# Patient Record
Sex: Female | Born: 1975
Health system: Southern US, Community
[De-identification: ages and names within clinical notes are randomized; demographics above are authoritative.]

## PROBLEM LIST (undated history)

## (undated) DIAGNOSIS — K219 Gastro-esophageal reflux disease without esophagitis: Secondary | ICD-10-CM

## (undated) HISTORY — PX: OTHER SURGICAL HISTORY: SHX169

## (undated) HISTORY — DX: Gastro-esophageal reflux disease without esophagitis: K21.9

---

## 1999-07-10 ENCOUNTER — Other Ambulatory Visit: Admission: RE | Admit: 1999-07-10 | Discharge: 1999-07-10 | Payer: Self-pay | Admitting: Obstetrics and Gynecology

## 2000-02-01 ENCOUNTER — Inpatient Hospital Stay (HOSPITAL_COMMUNITY): Admission: AD | Admit: 2000-02-01 | Discharge: 2000-02-01 | Payer: Self-pay | Admitting: *Deleted

## 2000-02-12 ENCOUNTER — Inpatient Hospital Stay (HOSPITAL_COMMUNITY): Admission: AD | Admit: 2000-02-12 | Discharge: 2000-02-12 | Payer: Self-pay | Admitting: Obstetrics and Gynecology

## 2000-02-13 ENCOUNTER — Inpatient Hospital Stay (HOSPITAL_COMMUNITY): Admission: AD | Admit: 2000-02-13 | Discharge: 2000-02-15 | Payer: Self-pay | Admitting: Obstetrics and Gynecology

## 2000-02-18 ENCOUNTER — Encounter: Admission: RE | Admit: 2000-02-18 | Discharge: 2000-05-18 | Payer: Self-pay | Admitting: Obstetrics and Gynecology

## 2000-03-26 ENCOUNTER — Other Ambulatory Visit: Admission: RE | Admit: 2000-03-26 | Discharge: 2000-03-26 | Payer: Self-pay | Admitting: Obstetrics and Gynecology

## 2001-05-06 ENCOUNTER — Other Ambulatory Visit: Admission: RE | Admit: 2001-05-06 | Discharge: 2001-05-06 | Payer: Self-pay | Admitting: Obstetrics and Gynecology

## 2002-05-11 ENCOUNTER — Other Ambulatory Visit: Admission: RE | Admit: 2002-05-11 | Discharge: 2002-05-11 | Payer: Self-pay | Admitting: Obstetrics and Gynecology

## 2003-11-28 ENCOUNTER — Other Ambulatory Visit: Admission: RE | Admit: 2003-11-28 | Discharge: 2003-11-28 | Payer: Self-pay | Admitting: Obstetrics and Gynecology

## 2004-06-18 ENCOUNTER — Inpatient Hospital Stay (HOSPITAL_COMMUNITY): Admission: AD | Admit: 2004-06-18 | Discharge: 2004-06-19 | Payer: Self-pay | Admitting: Obstetrics and Gynecology

## 2004-07-04 ENCOUNTER — Inpatient Hospital Stay (HOSPITAL_COMMUNITY): Admission: AD | Admit: 2004-07-04 | Discharge: 2004-07-04 | Payer: Self-pay | Admitting: Obstetrics and Gynecology

## 2004-07-07 ENCOUNTER — Inpatient Hospital Stay (HOSPITAL_COMMUNITY): Admission: AD | Admit: 2004-07-07 | Discharge: 2004-07-07 | Payer: Self-pay | Admitting: Obstetrics and Gynecology

## 2004-07-12 ENCOUNTER — Inpatient Hospital Stay (HOSPITAL_COMMUNITY): Admission: AD | Admit: 2004-07-12 | Discharge: 2004-07-14 | Payer: Self-pay | Admitting: Obstetrics and Gynecology

## 2004-11-28 ENCOUNTER — Other Ambulatory Visit: Admission: RE | Admit: 2004-11-28 | Discharge: 2004-11-28 | Payer: Self-pay | Admitting: Obstetrics and Gynecology

## 2005-07-08 HISTORY — PX: BREAST REDUCTION SURGERY: SHX8

## 2006-06-06 ENCOUNTER — Other Ambulatory Visit: Admission: RE | Admit: 2006-06-06 | Discharge: 2006-06-06 | Payer: Self-pay | Admitting: Obstetrics and Gynecology

## 2011-09-26 ENCOUNTER — Ambulatory Visit (INDEPENDENT_AMBULATORY_CARE_PROVIDER_SITE_OTHER): Payer: BLUE CROSS/BLUE SHIELD | Admitting: Obstetrics and Gynecology

## 2011-09-26 DIAGNOSIS — Z01419 Encounter for gynecological examination (general) (routine) without abnormal findings: Secondary | ICD-10-CM

## 2012-02-17 ENCOUNTER — Encounter: Payer: Self-pay | Admitting: Gastroenterology

## 2012-02-18 ENCOUNTER — Other Ambulatory Visit: Payer: Self-pay | Admitting: Gastroenterology

## 2012-02-18 DIAGNOSIS — R110 Nausea: Secondary | ICD-10-CM

## 2012-02-18 DIAGNOSIS — R109 Unspecified abdominal pain: Secondary | ICD-10-CM

## 2012-02-25 ENCOUNTER — Encounter (HOSPITAL_COMMUNITY)
Admission: RE | Admit: 2012-02-25 | Discharge: 2012-02-25 | Disposition: A | Discharge status: Home / Self Care | Payer: BC Managed Care – PPO | Source: Ambulatory Visit | Attending: Gastroenterology | Admitting: Gastroenterology

## 2012-02-25 ENCOUNTER — Ambulatory Visit (HOSPITAL_COMMUNITY)
Admission: RE | Admit: 2012-02-25 | Discharge: 2012-02-25 | Disposition: A | Discharge status: Home / Self Care | Payer: BC Managed Care – PPO | Source: Ambulatory Visit | Attending: Gastroenterology | Admitting: Gastroenterology

## 2012-02-25 DIAGNOSIS — R109 Unspecified abdominal pain: Secondary | ICD-10-CM

## 2012-02-25 DIAGNOSIS — R110 Nausea: Secondary | ICD-10-CM

## 2012-02-25 DIAGNOSIS — K7689 Other specified diseases of liver: Secondary | ICD-10-CM | POA: Insufficient documentation

## 2012-02-25 MED ORDER — TECHNETIUM TC 99M MEBROFENIN IV KIT
5.0000 | PACK | Freq: Once | INTRAVENOUS | Status: AC | PRN
  Administered 2012-02-25: 5 via INTRAVENOUS

## 2012-02-25 MED ORDER — SINCALIDE 5 MCG IJ SOLR
0.0200 ug/kg | Freq: Once | INTRAMUSCULAR | Status: AC
  Administered 2012-02-25: 1.5 ug via INTRAVENOUS

## 2012-03-13 ENCOUNTER — Ambulatory Visit: Payer: Self-pay | Admitting: Gastroenterology

## 2012-04-22 ENCOUNTER — Encounter (INDEPENDENT_AMBULATORY_CARE_PROVIDER_SITE_OTHER): Payer: Self-pay | Admitting: Surgery

## 2012-04-28 ENCOUNTER — Encounter (INDEPENDENT_AMBULATORY_CARE_PROVIDER_SITE_OTHER): Payer: Self-pay | Admitting: Surgery

## 2012-04-28 ENCOUNTER — Ambulatory Visit (INDEPENDENT_AMBULATORY_CARE_PROVIDER_SITE_OTHER): Payer: BC Managed Care – PPO | Admitting: Surgery

## 2012-04-28 VITALS — BP 118/81 | HR 80 | Temp 97.8°F | Resp 18 | Ht 64.5 in | Wt 171.4 lb

## 2012-04-28 DIAGNOSIS — K811 Chronic cholecystitis: Secondary | ICD-10-CM

## 2012-04-28 NOTE — Progress Notes (Signed)
Patient ID: Jocelyn Smith, female   DOB: October 26, 1975, 36 y.o.   MRN: [id number]  Chief Complaint  Patient presents with  . Other    abdominal pain, possible gallbladder problem    HPI Jocelyn Smith is a 36 y.o. female.   HPI He is referred by Dr. Loreta Ave for evaluation of epigastric abdominal pain, bloating, and pain through to the back after fatty meals. This is been going on for several months. She has slight nausea but no emesis. She has had a complete workup including ultrasound, HIDA scan, and upper endoscopy. She has had minimal relief with antacid medications. The pain is moderate in intensity. She has no other complaints Past Medical History  Diagnosis Date  . GERD (gastroesophageal reflux disease)     Past Surgical History  Procedure Date  . Breast reduction surgery 2007    History reviewed. No pertinent family history.  Social History History  Substance Use Topics  . Smoking status: Never Smoker   . Smokeless tobacco: Not on file  . Alcohol Use: No    Allergies  Allergen Reactions  . Sulfa Antibiotics Rash    Current Outpatient Prescriptions  Medication Sig Dispense Refill  . Dexlansoprazole (DEXILANT PO) Take by mouth.      Marland Kitchen MINOCYCLINE HCL PO Take by mouth.        Review of Systems Review of Systems  Constitutional: Negative for fever, chills and unexpected weight change.  HENT: Negative for hearing loss, congestion, sore throat, trouble swallowing and voice change.   Eyes: Negative for visual disturbance.  Respiratory: Negative for cough and wheezing.   Cardiovascular: Negative for chest pain, palpitations and leg swelling.  Gastrointestinal: Positive for nausea, abdominal pain and abdominal distention. Negative for vomiting, diarrhea, constipation, blood in stool and anal bleeding.  Genitourinary: Negative for hematuria, vaginal bleeding and difficulty urinating.  Musculoskeletal: Negative for arthralgias.  Skin: Negative for rash and wound.    Neurological: Negative for seizures, syncope and headaches.  Hematological: Negative for adenopathy. Does not bruise/bleed easily.  Psychiatric/Behavioral: Negative for confusion.    Blood pressure 118/81, pulse 80, temperature 97.8 F (36.6 C), temperature source Temporal, resp. rate 18, height 5' 4.5" (1.638 m), weight 171 lb 6.4 oz (77.747 kg).  Physical Exam Physical Exam  Constitutional: She is oriented to person, place, and time. She appears well-developed and well-nourished. No distress.  HENT:  Head: Normocephalic and atraumatic.  Right Ear: External ear normal.  Left Ear: External ear normal.  Nose: Nose normal.  Mouth/Throat: Oropharynx is clear and moist. No oropharyngeal exudate.  Eyes: Conjunctivae normal are normal. Pupils are equal, round, and reactive to light. Right eye exhibits no discharge. Left eye exhibits no discharge. No scleral icterus.  Neck: Normal range of motion. Neck supple. No tracheal deviation present.  Cardiovascular: Normal rate, regular rhythm, normal heart sounds and intact distal pulses.   No murmur heard. Pulmonary/Chest: Effort normal and breath sounds normal. No respiratory distress. She has no wheezes. She has no rales.  Abdominal: Soft. Bowel sounds are normal. She exhibits no distension. There is no tenderness. There is no rebound and no guarding.  Musculoskeletal: Normal range of motion. She exhibits no edema and no tenderness.  Lymphadenopathy:    She has no cervical adenopathy.  Neurological: She is alert and oriented to person, place, and time.  Skin: Skin is warm and dry. No rash noted. She is not diaphoretic. No erythema.  Psychiatric: Her behavior is normal. Judgment normal.    Data  Reviewed I have reviewed the patient's scans and endoscopy report  Assessment    Chronic cholecystitis    Plan    I do suspect the gallbladder causing her symptoms. Laparoscopic cholecystectomy is recommended and she is eager to proceed. I  discussed the risk of surgery which includes but is not limited to bleeding, infection, bile duct injury, bile leak, injury to other structures, the need to convert to an open procedure, the chance this may not resolve her symptoms, etc. She understands and wishes to proceed. Likelihood of success is good       Jocelyn Smith A 04/28/2012, 10:21 AM

## 2012-05-11 ENCOUNTER — Encounter (HOSPITAL_COMMUNITY): Payer: Self-pay | Admitting: Pharmacy Technician

## 2012-05-11 NOTE — Progress Notes (Signed)
Instructed as SDS . Aware to have responsible driver and person x 24 hrs post op. Aware to use Hibiclens soap to shower x2 nights prior and AM if shower desired,  per preparing for surgery guidelines. W. Kennon Portela

## 2012-05-19 NOTE — Interval H&P Note (Signed)
History and Physical Interval Note:  05/19/2012 6:55 AM  Jocelyn Smith  has presented today for surgery, with the diagnosis of chronic chol  The various methods of treatment have been discussed with the patient and family. After consideration of risks, benefits and other options for treatment, the patient has consented to  Procedure(s) (LRB) with comments: LAPAROSCOPIC CHOLECYSTECTOMY (N/A) as a surgical intervention .  The patient's history has been reviewed, patient examined, no change in status, stable for surgery.  I have reviewed the patient's chart and labs.  Questions were answered to the patient's satisfaction.     Spero Gunnels A

## 2012-05-19 NOTE — H&P (View-Only) (Signed)
Patient ID: Jocelyn Smith, female   DOB: 04/24/1976, 36 y.o.   MRN: [id number]  Chief Complaint  Patient presents with  . Other    abdominal pain, possible gallbladder problem    HPI Jocelyn Smith is a 36 y.o. female.   HPI He is referred by Dr. Mann for evaluation of epigastric abdominal pain, bloating, and pain through to the back after fatty meals. This is been going on for several months. She has slight nausea but no emesis. She has had a complete workup including ultrasound, HIDA scan, and upper endoscopy. She has had minimal relief with antacid medications. The pain is moderate in intensity. She has no other complaints Past Medical History  Diagnosis Date  . GERD (gastroesophageal reflux disease)     Past Surgical History  Procedure Date  . Breast reduction surgery 2007    History reviewed. No pertinent family history.  Social History History  Substance Use Topics  . Smoking status: Never Smoker   . Smokeless tobacco: Not on file  . Alcohol Use: No    Allergies  Allergen Reactions  . Sulfa Antibiotics Rash    Current Outpatient Prescriptions  Medication Sig Dispense Refill  . Dexlansoprazole (DEXILANT PO) Take by mouth.      . MINOCYCLINE HCL PO Take by mouth.        Review of Systems Review of Systems  Constitutional: Negative for fever, chills and unexpected weight change.  HENT: Negative for hearing loss, congestion, sore throat, trouble swallowing and voice change.   Eyes: Negative for visual disturbance.  Respiratory: Negative for cough and wheezing.   Cardiovascular: Negative for chest pain, palpitations and leg swelling.  Gastrointestinal: Positive for nausea, abdominal pain and abdominal distention. Negative for vomiting, diarrhea, constipation, blood in stool and anal bleeding.  Genitourinary: Negative for hematuria, vaginal bleeding and difficulty urinating.  Musculoskeletal: Negative for arthralgias.  Skin: Negative for rash and wound.    Neurological: Negative for seizures, syncope and headaches.  Hematological: Negative for adenopathy. Does not bruise/bleed easily.  Psychiatric/Behavioral: Negative for confusion.    Blood pressure 118/81, pulse 80, temperature 97.8 F (36.6 C), temperature source Temporal, resp. rate 18, height 5' 4.5" (1.638 m), weight 171 lb 6.4 oz (77.747 kg).  Physical Exam Physical Exam  Constitutional: She is oriented to person, place, and time. She appears well-developed and well-nourished. No distress.  HENT:  Head: Normocephalic and atraumatic.  Right Ear: External ear normal.  Left Ear: External ear normal.  Nose: Nose normal.  Mouth/Throat: Oropharynx is clear and moist. No oropharyngeal exudate.  Eyes: Conjunctivae normal are normal. Pupils are equal, round, and reactive to light. Right eye exhibits no discharge. Left eye exhibits no discharge. No scleral icterus.  Neck: Normal range of motion. Neck supple. No tracheal deviation present.  Cardiovascular: Normal rate, regular rhythm, normal heart sounds and intact distal pulses.   No murmur heard. Pulmonary/Chest: Effort normal and breath sounds normal. No respiratory distress. She has no wheezes. She has no rales.  Abdominal: Soft. Bowel sounds are normal. She exhibits no distension. There is no tenderness. There is no rebound and no guarding.  Musculoskeletal: Normal range of motion. She exhibits no edema and no tenderness.  Lymphadenopathy:    She has no cervical adenopathy.  Neurological: She is alert and oriented to person, place, and time.  Skin: Skin is warm and dry. No rash noted. She is not diaphoretic. No erythema.  Psychiatric: Her behavior is normal. Judgment normal.    Data   Reviewed I have reviewed the patient's scans and endoscopy report  Assessment    Chronic cholecystitis    Plan    I do suspect the gallbladder causing her symptoms. Laparoscopic cholecystectomy is recommended and she is eager to proceed. I  discussed the risk of surgery which includes but is not limited to bleeding, infection, bile duct injury, bile leak, injury to other structures, the need to convert to an open procedure, the chance this may not resolve her symptoms, etc. She understands and wishes to proceed. Likelihood of success is good       Arieliz Latino A 04/28/2012, 10:21 AM    

## 2012-05-20 ENCOUNTER — Encounter (HOSPITAL_COMMUNITY): Payer: Self-pay | Admitting: Anesthesiology

## 2012-05-20 ENCOUNTER — Ambulatory Visit (HOSPITAL_COMMUNITY)
Admission: RE | Admit: 2012-05-20 | Discharge: 2012-05-20 | Disposition: A | Discharge status: Home / Self Care | Payer: BC Managed Care – PPO | Source: Ambulatory Visit | Attending: Surgery | Admitting: Surgery

## 2012-05-20 ENCOUNTER — Encounter (HOSPITAL_COMMUNITY): Payer: Self-pay | Admitting: *Deleted

## 2012-05-20 ENCOUNTER — Encounter (HOSPITAL_COMMUNITY)
Admission: RE | Disposition: A | Discharge status: Home / Self Care | Payer: Self-pay | Source: Ambulatory Visit | Attending: Surgery

## 2012-05-20 ENCOUNTER — Ambulatory Visit (HOSPITAL_COMMUNITY): Payer: BC Managed Care – PPO | Admitting: Anesthesiology

## 2012-05-20 DIAGNOSIS — Z79899 Other long term (current) drug therapy: Secondary | ICD-10-CM | POA: Insufficient documentation

## 2012-05-20 DIAGNOSIS — K811 Chronic cholecystitis: Secondary | ICD-10-CM | POA: Insufficient documentation

## 2012-05-20 DIAGNOSIS — K824 Cholesterolosis of gallbladder: Secondary | ICD-10-CM

## 2012-05-20 DIAGNOSIS — K219 Gastro-esophageal reflux disease without esophagitis: Secondary | ICD-10-CM | POA: Insufficient documentation

## 2012-05-20 HISTORY — PX: CHOLECYSTECTOMY: SHX55

## 2012-05-20 SURGERY — LAPAROSCOPIC CHOLECYSTECTOMY
Anesthesia: General | Site: Abdomen | Wound class: Clean Contaminated

## 2012-05-20 MED ORDER — SODIUM CHLORIDE 0.9 % IV SOLN: 250.0000 mL | INTRAVENOUS | Status: DC | PRN

## 2012-05-20 MED ORDER — SUCCINYLCHOLINE CHLORIDE 20 MG/ML IJ SOLN
INTRAMUSCULAR | Status: DC | PRN
  Administered 2012-05-20: 100 mg via INTRAVENOUS

## 2012-05-20 MED ORDER — SODIUM CHLORIDE 0.9 % IJ SOLN: 3.0000 mL | INTRAMUSCULAR | Status: DC | PRN

## 2012-05-20 MED ORDER — 0.9 % SODIUM CHLORIDE (POUR BTL) OPTIME
TOPICAL | Status: DC | PRN
  Administered 2012-05-20: 1000 mL

## 2012-05-20 MED ORDER — ACETAMINOPHEN 10 MG/ML IV SOLN
INTRAVENOUS | Status: AC
  Filled 2012-05-20: qty 100

## 2012-05-20 MED ORDER — PROMETHAZINE HCL 25 MG/ML IJ SOLN
INTRAMUSCULAR | Status: AC
  Filled 2012-05-20: qty 1

## 2012-05-20 MED ORDER — LACTATED RINGERS IV SOLN
INTRAVENOUS | Status: DC | PRN
  Administered 2012-05-20 (×2): via INTRAVENOUS

## 2012-05-20 MED ORDER — PROMETHAZINE HCL 25 MG/ML IJ SOLN
6.2500 mg | INTRAMUSCULAR | Status: AC | PRN
  Administered 2012-05-20 (×2): 6.25 mg via INTRAVENOUS

## 2012-05-20 MED ORDER — METOCLOPRAMIDE HCL 5 MG/ML IJ SOLN
INTRAMUSCULAR | Status: DC | PRN
  Administered 2012-05-20: 10 mg via INTRAVENOUS

## 2012-05-20 MED ORDER — ONDANSETRON HCL 4 MG/2ML IJ SOLN: 4.0000 mg | Freq: Four times a day (QID) | INTRAMUSCULAR | Status: DC | PRN

## 2012-05-20 MED ORDER — PROPOFOL 10 MG/ML IV BOLUS
INTRAVENOUS | Status: DC | PRN
  Administered 2012-05-20: 150 mg via INTRAVENOUS

## 2012-05-20 MED ORDER — NEOSTIGMINE METHYLSULFATE 1 MG/ML IJ SOLN
INTRAMUSCULAR | Status: DC | PRN
  Administered 2012-05-20: 3.5 mg via INTRAVENOUS

## 2012-05-20 MED ORDER — DEXAMETHASONE SODIUM PHOSPHATE 10 MG/ML IJ SOLN
INTRAMUSCULAR | Status: DC | PRN
  Administered 2012-05-20: 10 mg via INTRAVENOUS

## 2012-05-20 MED ORDER — BUPIVACAINE HCL (PF) 0.5 % IJ SOLN
INTRAMUSCULAR | Status: DC | PRN
  Administered 2012-05-20: 20 mL

## 2012-05-20 MED ORDER — FENTANYL CITRATE 0.05 MG/ML IJ SOLN
INTRAMUSCULAR | Status: DC | PRN
  Administered 2012-05-20: 50 ug via INTRAVENOUS
  Administered 2012-05-20: 100 ug via INTRAVENOUS

## 2012-05-20 MED ORDER — MUPIROCIN 2 % EX OINT
TOPICAL_OINTMENT | CUTANEOUS | Status: AC
  Filled 2012-05-20: qty 22

## 2012-05-20 MED ORDER — SODIUM CHLORIDE 0.9 % IJ SOLN: 3.0000 mL | Freq: Two times a day (BID) | INTRAMUSCULAR | Status: DC

## 2012-05-20 MED ORDER — IOHEXOL 300 MG/ML  SOLN
INTRAMUSCULAR | Status: AC
Start: 2012-05-20 — End: 2012-05-20
  Filled 2012-05-20: qty 1

## 2012-05-20 MED ORDER — MORPHINE SULFATE 10 MG/ML IJ SOLN: 4.0000 mg | INTRAMUSCULAR | Status: DC | PRN

## 2012-05-20 MED ORDER — CEFAZOLIN SODIUM-DEXTROSE 2-3 GM-% IV SOLR
INTRAVENOUS | Status: AC
  Filled 2012-05-20: qty 50

## 2012-05-20 MED ORDER — ACETAMINOPHEN 650 MG RE SUPP
650.0000 mg | RECTAL | Status: DC | PRN
  Filled 2012-05-20: qty 1

## 2012-05-20 MED ORDER — LACTATED RINGERS IV SOLN
INTRAVENOUS | Status: DC | PRN
  Administered 2012-05-20: 1000 mL

## 2012-05-20 MED ORDER — LIDOCAINE HCL (CARDIAC) 20 MG/ML IV SOLN
INTRAVENOUS | Status: DC | PRN
  Administered 2012-05-20: 50 mg via INTRAVENOUS

## 2012-05-20 MED ORDER — OXYCODONE-ACETAMINOPHEN 5-325 MG PO TABS: ORAL_TABLET | ORAL | Status: DC

## 2012-05-20 MED ORDER — CEFAZOLIN SODIUM-DEXTROSE 2-3 GM-% IV SOLR
2.0000 g | INTRAVENOUS | Status: AC
  Administered 2012-05-20: 2 g via INTRAVENOUS

## 2012-05-20 MED ORDER — ROCURONIUM BROMIDE 100 MG/10ML IV SOLN
INTRAVENOUS | Status: DC | PRN
  Administered 2012-05-20: 10 mg via INTRAVENOUS

## 2012-05-20 MED ORDER — FENTANYL CITRATE 0.05 MG/ML IJ SOLN: 25.0000 ug | INTRAMUSCULAR | Status: DC | PRN

## 2012-05-20 MED ORDER — ACETAMINOPHEN 10 MG/ML IV SOLN
INTRAVENOUS | Status: DC | PRN
  Administered 2012-05-20: 1000 mg via INTRAVENOUS

## 2012-05-20 MED ORDER — KETOROLAC TROMETHAMINE 30 MG/ML IJ SOLN: 15.0000 mg | Freq: Once | INTRAMUSCULAR | Status: DC | PRN

## 2012-05-20 MED ORDER — ACETAMINOPHEN 325 MG PO TABS: 650.0000 mg | ORAL_TABLET | ORAL | Status: DC | PRN

## 2012-05-20 MED ORDER — BUPIVACAINE HCL (PF) 0.5 % IJ SOLN
INTRAMUSCULAR | Status: AC
  Filled 2012-05-20: qty 30

## 2012-05-20 MED ORDER — OXYCODONE HCL 5 MG PO TABS
5.0000 mg | ORAL_TABLET | ORAL | Status: DC | PRN
  Administered 2012-05-20: 5 mg via ORAL
  Filled 2012-05-20: qty 1

## 2012-05-20 MED ORDER — MIDAZOLAM HCL 5 MG/5ML IJ SOLN
INTRAMUSCULAR | Status: DC | PRN
  Administered 2012-05-20: 2 mg via INTRAVENOUS

## 2012-05-20 MED ORDER — KETOROLAC TROMETHAMINE 30 MG/ML IJ SOLN
INTRAMUSCULAR | Status: DC | PRN
  Administered 2012-05-20: 30 mg via INTRAVENOUS

## 2012-05-20 MED ORDER — ONDANSETRON HCL 4 MG/2ML IJ SOLN
INTRAMUSCULAR | Status: DC | PRN
  Administered 2012-05-20: 4 mg via INTRAVENOUS

## 2012-05-20 MED ORDER — GLYCOPYRROLATE 0.2 MG/ML IJ SOLN
INTRAMUSCULAR | Status: DC | PRN
  Administered 2012-05-20: 0.4 mg via INTRAVENOUS

## 2012-05-20 SURGICAL SUPPLY — 39 items
APL SKNCLS STERI-STRIP NONHPOA (GAUZE/BANDAGES/DRESSINGS) ×1
APPLIER CLIP 5 13 M/L LIGAMAX5 (MISCELLANEOUS) ×2
APR CLP MED LRG 5 ANG JAW (MISCELLANEOUS) ×1
BAG SPEC RTRVL LRG 6X4 10 (ENDOMECHANICALS) ×1
BANDAGE ADHESIVE 1X3 (GAUZE/BANDAGES/DRESSINGS) ×2 IMPLANT
BENZOIN TINCTURE PRP APPL 2/3 (GAUZE/BANDAGES/DRESSINGS) ×2 IMPLANT
CANISTER SUCTION 2500CC (MISCELLANEOUS) ×2 IMPLANT
CHLORAPREP W/TINT 26ML (MISCELLANEOUS) ×2 IMPLANT
CLIP APPLIE 5 13 M/L LIGAMAX5 (MISCELLANEOUS) IMPLANT
CLOTH BEACON ORANGE TIMEOUT ST (SAFETY) ×2 IMPLANT
COVER MAYO STAND STRL (DRAPES) IMPLANT
DECANTER SPIKE VIAL GLASS SM (MISCELLANEOUS) ×2 IMPLANT
DRAPE C-ARM 42X72 X-RAY (DRAPES) IMPLANT
DRAPE LAPAROSCOPIC ABDOMINAL (DRAPES) ×2 IMPLANT
ELECT REM PT RETURN 9FT ADLT (ELECTROSURGICAL) ×2
ELECTRODE REM PT RTRN 9FT ADLT (ELECTROSURGICAL) ×1 IMPLANT
GLOVE BIO SURGEON STRL SZ 6.5 (GLOVE) ×1 IMPLANT
GLOVE BIOGEL PI IND STRL 6.5 (GLOVE) IMPLANT
GLOVE BIOGEL PI IND STRL 7.0 (GLOVE) ×1 IMPLANT
GLOVE BIOGEL PI INDICATOR 6.5 (GLOVE) ×1
GLOVE BIOGEL PI INDICATOR 7.0 (GLOVE)
GLOVE SURG SIGNA 7.5 PF LTX (GLOVE) ×3 IMPLANT
GLOVE SURG SS PI 6.5 STRL IVOR (GLOVE) ×1 IMPLANT
GOWN STRL NON-REIN LRG LVL3 (GOWN DISPOSABLE) ×2 IMPLANT
GOWN STRL REIN XL XLG (GOWN DISPOSABLE) ×4 IMPLANT
HEMOSTAT SURGICEL 4X8 (HEMOSTASIS) IMPLANT
KIT BASIN OR (CUSTOM PROCEDURE TRAY) ×2 IMPLANT
NS IRRIG 1000ML POUR BTL (IV SOLUTION) ×1 IMPLANT
POUCH SPECIMEN RETRIEVAL 10MM (ENDOMECHANICALS) ×2 IMPLANT
SET CHOLANGIOGRAPH MIX (MISCELLANEOUS) IMPLANT
SET IRRIG TUBING LAPAROSCOPIC (IRRIGATION / IRRIGATOR) ×2 IMPLANT
SOLUTION ANTI FOG 6CC (MISCELLANEOUS) ×2 IMPLANT
STRIP CLOSURE SKIN 1/2X4 (GAUZE/BANDAGES/DRESSINGS) ×2 IMPLANT
SUT MNCRL AB 4-0 PS2 18 (SUTURE) ×2 IMPLANT
TOWEL OR 17X26 10 PK STRL BLUE (TOWEL DISPOSABLE) ×2 IMPLANT
TRAY LAP CHOLE (CUSTOM PROCEDURE TRAY) ×2 IMPLANT
TROCAR BLADELESS OPT 5 75 (ENDOMECHANICALS) ×6 IMPLANT
TROCAR XCEL BLUNT TIP 100MML (ENDOMECHANICALS) ×2 IMPLANT
TUBING INSUFFLATION 10FT LAP (TUBING) ×2 IMPLANT

## 2012-05-20 NOTE — Op Note (Signed)

## 2012-05-20 NOTE — Preoperative (Signed)
Beta Blockers   Reason not to administer Beta Blockers:Not Applicable 

## 2012-05-20 NOTE — Interval H&P Note (Signed)
History and Physical Interval Note: no change in H and P  05/20/2012 7:02 AM  Jocelyn Smith  has presented today for surgery, with the diagnosis of gallbladder problems  The various methods of treatment have been discussed with the patient and family. After consideration of risks, benefits and other options for treatment, the patient has consented to  Procedure(s) (LRB) with comments: LAPAROSCOPIC CHOLECYSTECTOMY (N/A) as a surgical intervention .  The patient's history has been reviewed, patient examined, no change in status, stable for surgery.  I have reviewed the patient's chart and labs.  Questions were answered to the patient's satisfaction.     Anas Reister A

## 2012-05-20 NOTE — Anesthesia Preprocedure Evaluation (Addendum)
Anesthesia Evaluation  Patient identified by MRN, date of birth, ID band Patient awake    Reviewed: Allergy & Precautions, H&P , NPO status , Patient's Chart, lab work & pertinent test results  Airway Mallampati: II TM Distance: <3 FB Neck ROM: Full    Dental No notable dental hx. (+) Dental Advisory Given   Pulmonary neg pulmonary ROS,  breath sounds clear to auscultation  Pulmonary exam normal       Cardiovascular negative cardio ROS  Rhythm:Regular Rate:Normal     Neuro/Psych negative neurological ROS  negative psych ROS   GI/Hepatic Neg liver ROS, GERD-  ,  Endo/Other  negative endocrine ROS  Renal/GU negative Renal ROS  negative genitourinary   Musculoskeletal negative musculoskeletal ROS (+)   Abdominal   Peds negative pediatric ROS (+)  Hematology negative hematology ROS (+)   Anesthesia Other Findings   Reproductive/Obstetrics negative OB ROS                           Anesthesia Physical Anesthesia Plan  ASA: I  Anesthesia Plan: General   Post-op Pain Management:    Induction: Intravenous  Airway Management Planned: Oral ETT  Additional Equipment:   Intra-op Plan:   Post-operative Plan: Extubation in OR  Informed Consent: I have reviewed the patients History and Physical, chart, labs and discussed the procedure including the risks, benefits and alternatives for the proposed anesthesia with the patient or authorized representative who has indicated his/her understanding and acceptance.   Dental advisory given  Plan Discussed with: CRNA and Surgeon  Anesthesia Plan Comments:         Anesthesia Quick Evaluation

## 2012-05-20 NOTE — Anesthesia Postprocedure Evaluation (Signed)
  Anesthesia Post-op Note  Patient: Jocelyn Smith  Procedure(s) Performed: Procedure(s) (LRB): LAPAROSCOPIC CHOLECYSTECTOMY (N/A)  Patient Location: PACU  Anesthesia Type: General  Level of Consciousness: awake and alert   Airway and Oxygen Therapy: Patient Spontanous Breathing  Post-op Pain: mild  Post-op Assessment: Post-op Vital signs reviewed, Patient's Cardiovascular Status Stable, Respiratory Function Stable, Patent Airway and No signs of Nausea or vomiting  Post-op Vital Signs: stable  Complications: No apparent anesthesia complications

## 2012-05-20 NOTE — Transfer of Care (Signed)
Immediate Anesthesia Transfer of Care Note  Patient: Jocelyn Smith  Procedure(s) Performed: Procedure(s) (LRB) with comments: LAPAROSCOPIC CHOLECYSTECTOMY (N/A)  Patient Location: PACU  Anesthesia Type:General  Level of Consciousness: sedated, patient cooperative and responds to stimulation  Airway & Oxygen Therapy: Patient Spontanous Breathing and Patient connected to face mask oxygen  Post-op Assessment: Report given to PACU RN, Post -op Vital signs reviewed and stable and Patient moving all extremities X 4  Post vital signs: Reviewed and stable  Complications: No apparent anesthesia complications

## 2012-05-20 NOTE — H&P (Signed)
Patient ID: Jocelyn Smith, female DOB: 1975-12-04, 36 y.o. MRN: [id number]  Chief Complaint   Patient presents with   .  Other     abdominal pain, possible gallbladder problem    HPI  Jocelyn Smith is a 36 y.o. female.  HPI  He is referred by Dr. Loreta Ave for evaluation of epigastric abdominal pain, bloating, and pain through to the back after fatty meals. This is been going on for several months. She has slight nausea but no emesis. She has had a complete workup including ultrasound, HIDA scan, and upper endoscopy. She has had minimal relief with antacid medications. The pain is moderate in intensity. She has no other complaints  Past Medical History   Diagnosis  Date   .  GERD (gastroesophageal reflux disease)     Past Surgical History   Procedure  Date   .  Breast reduction surgery  2007    History reviewed. No pertinent family history.  Social History  History   Substance Use Topics   .  Smoking status:  Never Smoker   .  Smokeless tobacco:  Not on file   .  Alcohol Use:  No    Allergies   Allergen  Reactions   .  Sulfa Antibiotics  Rash    Current Outpatient Prescriptions   Medication  Sig  Dispense  Refill   .  Dexlansoprazole (DEXILANT PO)  Take by mouth.     Marland Kitchen  MINOCYCLINE HCL PO  Take by mouth.      Review of Systems  Review of Systems  Constitutional: Negative for fever, chills and unexpected weight change.  HENT: Negative for hearing loss, congestion, sore throat, trouble swallowing and voice change.  Eyes: Negative for visual disturbance.  Respiratory: Negative for cough and wheezing.  Cardiovascular: Negative for chest pain, palpitations and leg swelling.  Gastrointestinal: Positive for nausea, abdominal pain and abdominal distention. Negative for vomiting, diarrhea, constipation, blood in stool and anal bleeding.  Genitourinary: Negative for hematuria, vaginal bleeding and difficulty urinating.  Musculoskeletal: Negative for arthralgias.  Skin: Negative  for rash and wound.  Neurological: Negative for seizures, syncope and headaches.  Hematological: Negative for adenopathy. Does not bruise/bleed easily.  Psychiatric/Behavioral: Negative for confusion.   Blood pressure 118/81, pulse 80, temperature 97.8 F (36.6 C), temperature source Temporal, resp. rate 18, height 5' 4.5" (1.638 m), weight 171 lb 6.4 oz (77.747 kg).  Physical Exam  Physical Exam  Constitutional: She is oriented to person, place, and time. She appears well-developed and well-nourished. No distress.  HENT:  Head: Normocephalic and atraumatic.  Right Ear: External ear normal.  Left Ear: External ear normal.  Nose: Nose normal.  Mouth/Throat: Oropharynx is clear and moist. No oropharyngeal exudate.  Eyes: Conjunctivae normal are normal. Pupils are equal, round, and reactive to light. Right eye exhibits no discharge. Left eye exhibits no discharge. No scleral icterus.  Neck: Normal range of motion. Neck supple. No tracheal deviation present.  Cardiovascular: Normal rate, regular rhythm, normal heart sounds and intact distal pulses.  No murmur heard.  Pulmonary/Chest: Effort normal and breath sounds normal. No respiratory distress. She has no wheezes. She has no rales.  Abdominal: Soft. Bowel sounds are normal. She exhibits no distension. There is no tenderness. There is no rebound and no guarding.  Musculoskeletal: Normal range of motion. She exhibits no edema and no tenderness.  Lymphadenopathy:  She has no cervical adenopathy.  Neurological: She is alert and oriented to person, place, and time.  Skin: Skin is warm and dry. No rash noted. She is not diaphoretic. No erythema.  Psychiatric: Her behavior is normal. Judgment normal.   Data Reviewed  I have reviewed the patient's scans and endoscopy report  Assessment   Chronic cholecystitis   Plan   I do suspect the gallbladder causing her symptoms. Laparoscopic cholecystectomy is recommended and she is eager to  proceed. I discussed the risk of surgery which includes but is not limited to bleeding, infection, bile duct injury, bile leak, injury to other structures, the need to convert to an open procedure, the chance this may not resolve her symptoms, etc. She understands and wishes to proceed. Likelihood of success is good   Creed Kail A

## 2012-05-20 NOTE — Progress Notes (Signed)
Nausea relieved after taking a nap

## 2012-05-20 NOTE — Progress Notes (Signed)
Continues to complain with nausea. Up to BR frequent burps and feeling a little better. Back to bed to sleep

## 2012-05-21 ENCOUNTER — Encounter (HOSPITAL_COMMUNITY): Payer: Self-pay | Admitting: Surgery

## 2012-05-23 LAB — MRSA CULTURE

## 2012-05-25 ENCOUNTER — Encounter (INDEPENDENT_AMBULATORY_CARE_PROVIDER_SITE_OTHER): Payer: Self-pay | Admitting: General Surgery

## 2012-06-02 ENCOUNTER — Encounter (INDEPENDENT_AMBULATORY_CARE_PROVIDER_SITE_OTHER): Payer: Self-pay | Admitting: Surgery

## 2012-06-02 ENCOUNTER — Ambulatory Visit (INDEPENDENT_AMBULATORY_CARE_PROVIDER_SITE_OTHER): Payer: BC Managed Care – PPO | Admitting: Surgery

## 2012-06-02 VITALS — BP 104/68 | HR 84 | Temp 98.0°F | Ht 64.0 in | Wt 176.2 lb

## 2012-06-02 DIAGNOSIS — Z09 Encounter for follow-up examination after completed treatment for conditions other than malignant neoplasm: Secondary | ICD-10-CM

## 2012-06-02 NOTE — Progress Notes (Signed)
Subjective:     Patient ID: Jocelyn Smith, female   DOB: Dec 26, 1975, 36 y.o.   MRN: [id number]  HPI She is here for her first postop visit status post laparoscopic cholecystectomy. Most of her symptoms have resolved. She is having occasional loose bowel movements.  Review of Systems     Objective:   Physical Exam Her incisions are well healed. The final pathology showed mild chronic cholecystitis    Assessment:     Patient doing well status post laparoscopic cholecystectomy    Plan:      she may return to normal activity.I will see her back as needed

## 2012-07-21 ENCOUNTER — Encounter (INDEPENDENT_AMBULATORY_CARE_PROVIDER_SITE_OTHER): Payer: Self-pay

## 2013-09-27 IMAGING — NM NM HEPATO W/GB/PHARM/[PERSON_NAME]
3 series · 13 of 13 positions shown · non-contrast
Comparison: 02/25/2012 ultrasound abdomen

CLINICAL DATA: Abdominal pain and nausea

NUCLEAR MEDICINE HEPATOBILIARY IMAGING WITH GALLBLADDER EF
TECHNIQUE: Sequential images of the abdomen were obtained [DATE] minutes following intravenous administration of
radiopharmaceutical.  After slow intravenous infusion of
micrograms Cholecystokinin, gallbladder ejection fraction was
determined.
Radiopharmaceutical:  5.0 mCi 7c-TTm Choletec

[he hepatobiliary · 3.43mm/px · 6 of 30 frames shown (1 of 3)]
[frame 3/30]
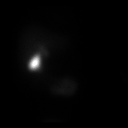
[frame 8/30]
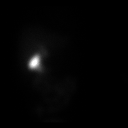
[frame 13/30]
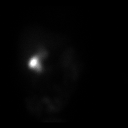
[frame 18/30]
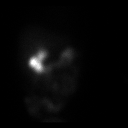
[frame 23/30]
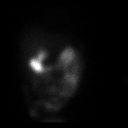
[frame 28/30]
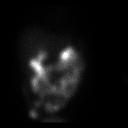

[he hepatobiliary · 3.43mm/px · 6 of 48 frames shown (2 of 3)]
[frame 5/48]
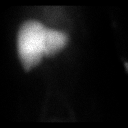
[frame 13/48]
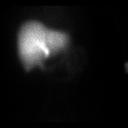
[frame 21/48]
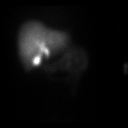
[frame 29/48]
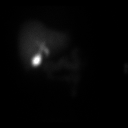
[frame 37/48]
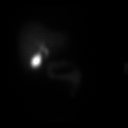
[frame 45/48]
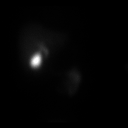

[he hepatobiliary · 1 of 1 slices shown (3 of 3)]
[im 1/1]
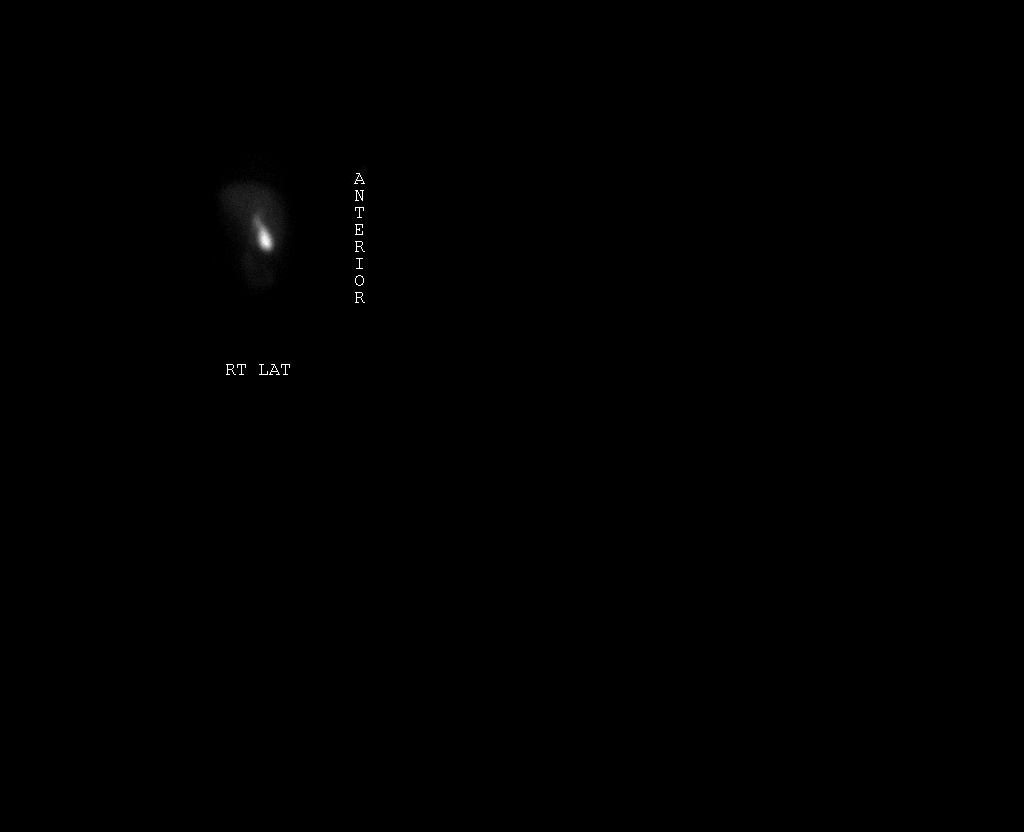

[13 of 13 positions shown; findings below may reference images not displayed]

FINDINGS: Normal hepatic uptake and excretion of the biliary
tracer.  Gallbladder is well visualized and continues to accumulate
activity during the study.  Activity progresses throughout the
small bowel.  Therefore, the cystic duct and common bile duct are
patent.  Negative for cholecystitis.

CCK administered for gallbladder stimulation.  Ejection fraction
calculated after 30 minutes measuring 75% which is within normal
limits.

The patient did not experience symptoms during CCK infusion.
IMPRESSION: Normal hepatobiliary scan and gallbladder ejection fraction

## 2016-07-25 ENCOUNTER — Emergency Department (HOSPITAL_BASED_OUTPATIENT_CLINIC_OR_DEPARTMENT_OTHER)
Admission: EM | Admit: 2016-07-25 | Discharge: 2016-07-25 | Disposition: A | Discharge status: Home / Self Care | Payer: 59 | Attending: Emergency Medicine | Admitting: Emergency Medicine

## 2016-07-25 ENCOUNTER — Encounter (HOSPITAL_BASED_OUTPATIENT_CLINIC_OR_DEPARTMENT_OTHER): Payer: Self-pay | Admitting: *Deleted

## 2016-07-25 DIAGNOSIS — M545 Low back pain: Secondary | ICD-10-CM | POA: Diagnosis present

## 2016-07-25 DIAGNOSIS — M5440 Lumbago with sciatica, unspecified side: Secondary | ICD-10-CM | POA: Diagnosis not present

## 2016-07-25 MED ORDER — HYDROCODONE-ACETAMINOPHEN 5-325 MG PO TABS: 1.0000 | ORAL_TABLET | Freq: Four times a day (QID) | ORAL | 0 refills | Status: AC | PRN

## 2016-07-25 MED ORDER — HYDROCODONE-ACETAMINOPHEN 5-325 MG PO TABS
2.0000 | ORAL_TABLET | Freq: Once | ORAL | Status: AC
  Administered 2016-07-25: 2 via ORAL
  Filled 2016-07-25: qty 2

## 2016-07-25 MED ORDER — KETOROLAC TROMETHAMINE 60 MG/2ML IM SOLN
60.0000 mg | Freq: Once | INTRAMUSCULAR | Status: AC
  Administered 2016-07-25: 60 mg via INTRAMUSCULAR
  Filled 2016-07-25: qty 2

## 2016-07-25 MED ORDER — NAPROXEN 500 MG PO TABS: 500.0000 mg | ORAL_TABLET | Freq: Two times a day (BID) | ORAL | 0 refills | Status: AC

## 2016-07-25 MED FILL — HYDROCODON-APAP 5-325: 5-325 | 2 days supply | Qty: 15 | Fill #0

## 2016-07-25 MED FILL — NAPROXEN 500 MG TABLET: 500 | 10 days supply | Qty: 20 | Fill #0

## 2016-07-25 NOTE — ED Notes (Signed)
Pt has a ride at bedside. Directed to pharmacy to pick up medications

## 2016-07-25 NOTE — ED Triage Notes (Signed)
Pt reports hx of back pain, usually takes half tab of valium and pain resolved, took half tab yesterday with no relief, cont with pain. Took motrin at [facility]3am.

## 2016-07-25 NOTE — ED Provider Notes (Signed)
[facility] Provider Note   CSN: [id number] Arrival date & time: 07/25/16  1025     History   Chief Complaint Chief Complaint  Patient presents with  . Back Pain    HPI Jocelyn Smith is a 41 y.o. female.  Patient is a 41 year old female with history of GERD and intermittent back pain over the past 2 years. She presents for evaluation of back pain. This worsened several days ago in the absence of any injury or trauma. She reports pain in her low back that radiates into her legs. She denies any weakness or bowel or bladder complaints.   The history is provided by the patient.  Back Pain   This is a recurrent problem. The current episode started 2 days ago. The problem occurs constantly. The problem has been gradually worsening. The pain is associated with no known injury. The pain is present in the lumbar spine. The quality of the pain is described as stabbing. The pain radiates to the left thigh and right thigh. The pain is moderate. The symptoms are aggravated by bending, twisting and certain positions. The pain is the same all the time. Pertinent negatives include no fever, no bowel incontinence, no bladder incontinence, no dysuria, no paresis, no tingling and no weakness. She has tried NSAIDs for the symptoms.    Past Medical History:  Diagnosis Date  . GERD (gastroesophageal reflux disease)     Patient Active Problem List   Diagnosis Date Noted  . Chronic cholecystitis 04/28/2012    Past Surgical History:  Procedure Laterality Date  . BREAST REDUCTION SURGERY  2007  . CHOLECYSTECTOMY  05/20/2012   Procedure: LAPAROSCOPIC CHOLECYSTECTOMY;  Surgeon: Shelly Rubensteinouglas A Blackman, MD;  Location: [facility];  Service: General;  Laterality: N/A;    OB History    No data available       Home Medications    Prior to Admission medications   Medication Sig Start Date End Date Taking? Authorizing Provider  omeprazole (PRILOSEC) 20 MG capsule Take 20 mg by mouth daily.    Yes Historical Provider, MD    Family History History reviewed. No pertinent family history.  Social History Social History  Substance Use Topics  . Smoking status: Never Smoker  . Smokeless tobacco: Never Used  . Alcohol use No     Allergies   Sulfa antibiotics   Review of Systems Review of Systems  Constitutional: Negative for fever.  Gastrointestinal: Negative for bowel incontinence.  Genitourinary: Negative for bladder incontinence and dysuria.  Musculoskeletal: Positive for back pain.  Neurological: Negative for tingling and weakness.  All other systems reviewed and are negative.    Physical Exam Updated Vital Signs BP 152/88 (BP Location: Left Arm)   Pulse 91   Temp 98.2 F (36.8 C) (Oral)   Resp 18   LMP 07/02/2016   SpO2 100%   Physical Exam  Constitutional: She is oriented to person, place, and time. She appears well-developed and well-nourished. No distress.  HENT:  Head: Normocephalic and atraumatic.  Neck: Normal range of motion. Neck supple.  Musculoskeletal:  There is tenderness to palpation in the soft tissues of lumbar region. There is no bony tenderness or step-off.  Neurological: She is alert and oriented to person, place, and time.  DTRs are 2+ and symmetrical in the patellar and Achilles tendons bilaterally. Strength is 5 out of 5 in both lower extremities. She is able to walk on heels and toes without difficulty.  Skin: Skin is warm  and dry. She is not diaphoretic.  Nursing note and vitals reviewed.    ED Treatments / Results  Labs (all labs ordered are listed, but only abnormal results are displayed) Labs Reviewed - No data to display  EKG  EKG Interpretation None       Radiology No results found.  Procedures Procedures (including critical care time)  Medications Ordered in ED Medications  ketorolac (TORADOL) injection 60 mg (not administered)  HYDROcodone-acetaminophen (NORCO/VICODIN) 5-325 MG per tablet 2 tablet (not  administered)     Initial Impression / Assessment and Plan / ED Course  I have reviewed the triage vital signs and the nursing notes.  Pertinent labs & imaging results that were available during my care of the patient were reviewed by me and considered in my medical decision making (see chart for details).     Patient with a flareup of low back pain. There are no red flags that would suggest an emergent etiology and she will be treated with NSAIDs, pain medication, and when necessary follow-up.  Final Clinical Impressions(s) / ED Diagnoses   Final diagnoses:  None    New Prescriptions New Prescriptions   No medications on file     Geoffery Lyons, MD 07/25/16 1052

## 2016-07-25 NOTE — Discharge Instructions (Signed)
Naproxen as prescribed.  Hydrocodone as prescribed as needed for pain not relieved with naproxen.  Follow-up with your primary Dr. if you're not improving in the next week to discuss physical therapy or possibly imaging studies.

## 2017-04-15 DIAGNOSIS — N920 Excessive and frequent menstruation with regular cycle: Secondary | ICD-10-CM | POA: Diagnosis not present

## 2017-04-15 DIAGNOSIS — Z1231 Encounter for screening mammogram for malignant neoplasm of breast: Secondary | ICD-10-CM | POA: Diagnosis not present

## 2017-04-15 DIAGNOSIS — Z01419 Encounter for gynecological examination (general) (routine) without abnormal findings: Secondary | ICD-10-CM | POA: Diagnosis not present

## 2017-04-15 DIAGNOSIS — Z6829 Body mass index (BMI) 29.0-29.9, adult: Secondary | ICD-10-CM | POA: Diagnosis not present

## 2017-04-15 DIAGNOSIS — Z842 Family history of other diseases of the genitourinary system: Secondary | ICD-10-CM | POA: Diagnosis not present

## 2017-04-15 DIAGNOSIS — R69 Illness, unspecified: Secondary | ICD-10-CM | POA: Diagnosis not present

## 2017-04-25 DIAGNOSIS — Z23 Encounter for immunization: Secondary | ICD-10-CM | POA: Diagnosis not present

## 2017-05-13 DIAGNOSIS — Z842 Family history of other diseases of the genitourinary system: Secondary | ICD-10-CM | POA: Diagnosis not present

## 2017-08-21 DIAGNOSIS — Z0100 Encounter for examination of eyes and vision without abnormal findings: Secondary | ICD-10-CM | POA: Diagnosis not present

## 2017-11-05 DIAGNOSIS — B070 Plantar wart: Secondary | ICD-10-CM | POA: Diagnosis not present

## 2018-03-11 DIAGNOSIS — Z0000 Encounter for general adult medical examination without abnormal findings: Secondary | ICD-10-CM | POA: Diagnosis not present

## 2018-03-11 DIAGNOSIS — Z23 Encounter for immunization: Secondary | ICD-10-CM | POA: Diagnosis not present

## 2018-05-27 DIAGNOSIS — R05 Cough: Secondary | ICD-10-CM | POA: Diagnosis not present

## 2018-09-09 DIAGNOSIS — Z01419 Encounter for gynecological examination (general) (routine) without abnormal findings: Secondary | ICD-10-CM | POA: Diagnosis not present

## 2018-09-09 DIAGNOSIS — Z8041 Family history of malignant neoplasm of ovary: Secondary | ICD-10-CM | POA: Diagnosis not present

## 2018-09-09 DIAGNOSIS — N946 Dysmenorrhea, unspecified: Secondary | ICD-10-CM | POA: Diagnosis not present

## 2018-09-09 DIAGNOSIS — Z1231 Encounter for screening mammogram for malignant neoplasm of breast: Secondary | ICD-10-CM | POA: Diagnosis not present

## 2018-09-09 DIAGNOSIS — Z6830 Body mass index (BMI) 30.0-30.9, adult: Secondary | ICD-10-CM | POA: Diagnosis not present

## 2018-09-10 DIAGNOSIS — B070 Plantar wart: Secondary | ICD-10-CM | POA: Diagnosis not present

## 2018-09-10 DIAGNOSIS — D2372 Other benign neoplasm of skin of left lower limb, including hip: Secondary | ICD-10-CM | POA: Diagnosis not present

## 2018-09-22 DIAGNOSIS — Z8041 Family history of malignant neoplasm of ovary: Secondary | ICD-10-CM | POA: Diagnosis not present

## 2019-04-30 DIAGNOSIS — Z20828 Contact with and (suspected) exposure to other viral communicable diseases: Secondary | ICD-10-CM | POA: Diagnosis not present

## 2019-04-30 DIAGNOSIS — Z119 Encounter for screening for infectious and parasitic diseases, unspecified: Secondary | ICD-10-CM | POA: Diagnosis not present

## 2020-01-31 ENCOUNTER — Other Ambulatory Visit: Payer: Self-pay

## 2020-01-31 ENCOUNTER — Emergency Department (HOSPITAL_BASED_OUTPATIENT_CLINIC_OR_DEPARTMENT_OTHER)
Admission: EM | Admit: 2020-01-31 | Discharge: 2020-01-31 | Disposition: A | Discharge status: Home / Self Care | Payer: 59 | Attending: Emergency Medicine | Admitting: Emergency Medicine

## 2020-01-31 ENCOUNTER — Encounter (HOSPITAL_BASED_OUTPATIENT_CLINIC_OR_DEPARTMENT_OTHER): Payer: Self-pay | Admitting: *Deleted

## 2020-01-31 ENCOUNTER — Emergency Department (HOSPITAL_BASED_OUTPATIENT_CLINIC_OR_DEPARTMENT_OTHER): Payer: 59

## 2020-01-31 DIAGNOSIS — R109 Unspecified abdominal pain: Secondary | ICD-10-CM | POA: Diagnosis not present

## 2020-01-31 DIAGNOSIS — R112 Nausea with vomiting, unspecified: Secondary | ICD-10-CM | POA: Insufficient documentation

## 2020-01-31 DIAGNOSIS — R197 Diarrhea, unspecified: Secondary | ICD-10-CM | POA: Diagnosis not present

## 2020-01-31 DIAGNOSIS — K529 Noninfective gastroenteritis and colitis, unspecified: Secondary | ICD-10-CM

## 2020-01-31 LAB — COMPREHENSIVE METABOLIC PANEL
ALT: 106 U/L — ABNORMAL HIGH (ref 0–44)
AST: 42 U/L — ABNORMAL HIGH (ref 15–41)
Albumin: 4.1 g/dL (ref 3.5–5.0)
Alkaline Phosphatase: 99 U/L (ref 38–126)
Anion gap: 11 (ref 5–15)
BUN: 14 mg/dL (ref 6–20)
CO2: 19 mmol/L — ABNORMAL LOW (ref 22–32)
Calcium: 9.2 mg/dL (ref 8.9–10.3)
Chloride: 107 mmol/L (ref 98–111)
Creatinine, Ser: 0.82 mg/dL (ref 0.44–1.00)
GFR calc Af Amer: >60 mL/min (ref >60)
GFR calc non Af Amer: >60 mL/min (ref >60)
Glucose, Bld: 123 mg/dL — ABNORMAL HIGH (ref 70–99)
Potassium: 3.5 mmol/L (ref 3.5–5.1)
Sodium: 137 mmol/L (ref 135–145)
Total Bilirubin: 0.5 mg/dL (ref 0.3–1.2)
Total Protein: 7.5 g/dL (ref 6.5–8.1)

## 2020-01-31 LAB — URINALYSIS, MICROSCOPIC (REFLEX)

## 2020-01-31 LAB — CBC
HCT: 44.7 % (ref 36.0–46.0)
Hemoglobin: 14.9 g/dL (ref 12.0–15.0)
MCH: 29.2 pg (ref 26.0–34.0)
MCHC: 33.3 g/dL (ref 30.0–36.0)
MCV: 87.6 fL (ref 80.0–100.0)
Platelets: 342 10*3/uL (ref 150–400)
RBC: 5.1 MIL/uL (ref 3.87–5.11)
RDW: 13.8 % (ref 11.5–15.5)
WBC: 11.5 10*3/uL — ABNORMAL HIGH (ref 4.0–10.5)
nRBC: 0 % (ref 0.0–0.2)

## 2020-01-31 LAB — URINALYSIS, ROUTINE W REFLEX MICROSCOPIC
Bilirubin Urine: NEGATIVE
Glucose, UA: NEGATIVE mg/dL
Ketones, ur: NEGATIVE mg/dL
Leukocytes,Ua: NEGATIVE
Nitrite: NEGATIVE
Protein, ur: NEGATIVE mg/dL
Specific Gravity, Urine: 1.025 (ref 1.005–1.030)
pH: 6 (ref 5.0–8.0)

## 2020-01-31 LAB — PREGNANCY, URINE: Preg Test, Ur: NEGATIVE

## 2020-01-31 MED ORDER — SODIUM CHLORIDE 0.9 % IV BOLUS
1000.0000 mL | Freq: Once | INTRAVENOUS | Status: AC
  Administered 2020-01-31: 1000 mL via INTRAVENOUS

## 2020-01-31 MED ORDER — IOHEXOL 300 MG/ML  SOLN
100.0000 mL | Freq: Once | INTRAMUSCULAR | Status: AC | PRN
  Administered 2020-01-31: 100 mL via INTRAVENOUS

## 2020-01-31 MED ORDER — HYDROMORPHONE HCL 1 MG/ML IJ SOLN
0.5000 mg | Freq: Once | INTRAMUSCULAR | Status: AC
  Administered 2020-01-31: 0.5 mg via INTRAVENOUS
  Filled 2020-01-31: qty 1

## 2020-01-31 NOTE — Discharge Instructions (Addendum)
It was our pleasure to provide your ER care today - we hope that you feel better. Rest. Drink plenty of fluids.   Your ct scan showed no acute process, however several incidental findings were noted (see below, and discuss with primary care doctor at follow up in the next 1-2 weeks, have them arrange follow up): IMPRESSION: 1. No bowel wall thickening or bowel obstruction. Appendix appears normal. No abscess evident in the abdomen or pelvis. 2. Nonobstructing 2 mm calculus mid right kidney. No hydronephrosis on either side. No ureteral calculus on either side. Urinary bladder wall thickness normal. 3. Lentiform shaped fluid collection in the anterior abdominal wall slightly superior to the umbilicus. This collection measures 4.2 x 1.2 x 3.5 cm. Suspect liquified hematoma in this area, potentially of postoperative etiology. 4. Left adrenal mass measuring 1.8 x 1.7 cm. This mass must be considered indeterminate based on attenuation criteria. Per consensus guidelines, a CT in 1 year to reassess this left adrenal mass advised. 5.  Suspected leiomyomatous uterus. 6.  Apparent collapsed right ovarian cyst measuring 1.6 x 1.6 cm. 7.  Gallbladder absent. 8.  Small hiatal hernia.   Follow up with primary care doctor in 2-3 days if symptoms fail to improve/resolve.  Return to ER if worse, new symptoms, fevers, persistent vomiting, severe or worsening abdominal pain, blood in stool, or other concern.   You were given pain medication in the ER - no driving for the next 6 hours.

## 2020-01-31 NOTE — ED Triage Notes (Signed)
Pt reports n/v/d since Friday. Reports vomiting resolved but she is still having a lot of diarrhea

## 2020-01-31 NOTE — ED Provider Notes (Signed)
[facility] EMERGENCY DEPARTMENT Provider Note   CSN: [id number] Arrival date & time: 01/31/20  0215     History Chief Complaint  Patient presents with  . Emesis    Jocelyn RipaJessica L Hessling is a 44 y.o. female.  Patient presents with nausea, vomiting, and diarrhea. Symptoms acute onset 4 days ago, with nvd. Had 3-4 episodes of emesis, not bloody or bilious - vomiting has resolved. Diarrhea has been persistent, watery, frequent, not bloody - diarrhea improved for several hours yesterday, but then returned. No hx chronic gi symptoms. Remote hx cholecystectomy. Denies abd distension. No fever or chills. No dysuria or gu c/o. No vaginal discharge or bleeding. No known bad food ingestion or ill contacts. No recent abx use or travel.   The history is provided by the patient.  Emesis Associated symptoms: diarrhea   Associated symptoms: no abdominal pain, no chills, no cough, no fever, no headaches and no sore throat        Past Medical History:  Diagnosis Date  . GERD (gastroesophageal reflux disease)     Patient Active Problem List   Diagnosis Date Noted  . Chronic cholecystitis 04/28/2012    Past Surgical History:  Procedure Laterality Date  . BREAST REDUCTION SURGERY  2007  . CHOLECYSTECTOMY  05/20/2012   Procedure: LAPAROSCOPIC CHOLECYSTECTOMY;  Surgeon: Shelly Rubensteinouglas A Blackman, MD;  Location: [facility];  Service: General;  Laterality: N/A;  . tummy tuck       OB History   No obstetric history on file.     No family history on file.  Social History   Tobacco Use  . Smoking status: Never Smoker  . Smokeless tobacco: Never Used  Substance Use Topics  . Alcohol use: No  . Drug use: No    Home Medications Prior to Admission medications   Medication Sig Start Date End Date Taking? Authorizing Provider  HYDROcodone-acetaminophen (NORCO) 5-325 MG tablet Take 1-2 tablets by mouth every 6 (six) hours as needed. 07/25/16   Geoffery Lyonselo, Douglas, MD  naproxen (NAPROSYN) 500 MG  tablet Take 1 tablet (500 mg total) by mouth 2 (two) times daily. 07/25/16   Geoffery Lyonselo, Douglas, MD  omeprazole (PRILOSEC) 20 MG capsule Take 20 mg by mouth daily.    [provider]    Allergies    Sulfa antibiotics  Review of Systems   Review of Systems  Constitutional: Negative for chills and fever.  HENT: Negative for sore throat.   Eyes: Negative for redness.  Respiratory: Negative for cough and shortness of breath.   Cardiovascular: Negative for chest pain.  Gastrointestinal: Positive for diarrhea, nausea and vomiting. Negative for abdominal pain.  Genitourinary: Negative for dysuria and flank pain.  Musculoskeletal: Negative for neck pain and neck stiffness.  Skin: Negative for rash.  Neurological: Negative for headaches.  Hematological: Does not bruise/bleed easily.  Psychiatric/Behavioral: Negative for confusion.    Physical Exam Updated Vital Signs BP (!) 125/100 (BP Location: Right Arm)   Pulse 98   Temp 98 F (36.7 C) (Oral)   Resp 18   Ht 1.626 m (5\' 4" )   Wt 77.1 kg   LMP 01/08/2020   SpO2 100%   BMI 29.18 kg/m   Physical Exam Vitals and nursing note reviewed.  Constitutional:      Appearance: Normal appearance. She is well-developed.  HENT:     Head: Atraumatic.     Nose: Nose normal.     Mouth/Throat:     Mouth: Mucous membranes are moist.  Eyes:     General: No scleral icterus.    Conjunctiva/sclera: Conjunctivae normal.  Neck:     Trachea: No tracheal deviation.  Cardiovascular:     Rate and Rhythm: Normal rate and regular rhythm.     Pulses: Normal pulses.     Heart sounds: Normal heart sounds. No murmur heard.  No friction rub. No gallop.   Pulmonary:     Effort: Pulmonary effort is normal. No respiratory distress.     Breath sounds: Normal breath sounds.  Abdominal:     General: Bowel sounds are normal. There is no distension.     Palpations: Abdomen is soft. There is no mass.     Tenderness: There is no abdominal tenderness. There  is no guarding or rebound.     Hernia: No hernia is present.  Genitourinary:    Comments: No cva tenderness.  Musculoskeletal:        General: No swelling.     Cervical back: Normal range of motion and neck supple. No rigidity. No muscular tenderness.  Skin:    General: Skin is warm and dry.     Findings: No rash.  Neurological:     Mental Status: She is alert.     Comments: Alert, speech normal.   Psychiatric:        Mood and Affect: Mood normal.     ED Results / Procedures / Treatments   Labs (all labs ordered are listed, but only abnormal results are displayed) Results for orders placed or performed during the hospital encounter of 01/31/20  CBC  Result Value Ref Range   WBC 11.5 (H) 4.0 - 10.5 K/uL   RBC 5.10 3.87 - 5.11 MIL/uL   Hemoglobin 14.9 12.0 - 15.0 g/dL   HCT 32.9 36 - 46 %   MCV 87.6 80.0 - 100.0 fL   MCH 29.2 26.0 - 34.0 pg   MCHC 33.3 30.0 - 36.0 g/dL   RDW 51.8 84.1 - 66.0 %   Platelets 342 150 - 400 K/uL   nRBC 0.0 0.0 - 0.2 %  Comprehensive metabolic panel  Result Value Ref Range   Sodium 137 135 - 145 mmol/L   Potassium 3.5 3.5 - 5.1 mmol/L   Chloride 107 98 - 111 mmol/L   CO2 19 (L) 22 - 32 mmol/L   Glucose, Bld 123 (H) 70 - 99 mg/dL   BUN 14 6 - 20 mg/dL   Creatinine, Ser 6.30 0.44 - 1.00 mg/dL   Calcium 9.2 8.9 - 16.0 mg/dL   Total Protein 7.5 6.5 - 8.1 g/dL   Albumin 4.1 3.5 - 5.0 g/dL   AST 42 (H) 15 - 41 U/L   ALT 106 (H) 0 - 44 U/L   Alkaline Phosphatase 99 38 - 126 U/L   Total Bilirubin 0.5 0.3 - 1.2 mg/dL   GFR calc non Af Amer >60 >60 mL/min   GFR calc Af Amer >60 >60 mL/min   Anion gap 11 5 - 15  Urinalysis, Routine w reflex microscopic  Result Value Ref Range   Color, Urine YELLOW YELLOW   APPearance CLEAR CLEAR   Specific Gravity, Urine 1.025 1.005 - 1.030   pH 6.0 5.0 - 8.0   Glucose, UA NEGATIVE NEGATIVE mg/dL   Hgb urine dipstick MODERATE (A) NEGATIVE   Bilirubin Urine NEGATIVE NEGATIVE   Ketones, ur NEGATIVE NEGATIVE  mg/dL   Protein, ur NEGATIVE NEGATIVE mg/dL   Nitrite NEGATIVE NEGATIVE   Leukocytes,Ua NEGATIVE NEGATIVE  Pregnancy, urine  Result Value Ref Range   Preg Test, Ur NEGATIVE NEGATIVE  Urinalysis, Microscopic (reflex)  Result Value Ref Range   RBC / HPF 6-10 0 - 5 RBC/hpf   WBC, UA 0-5 0 - 5 WBC/hpf   Bacteria, UA MANY (A) NONE SEEN   Squamous Epithelial / LPF 11-20 0 - 5   WBC Clumps PRESENT    Mucus PRESENT    Hyaline Casts, UA PRESENT     EKG None  Radiology CT Abdomen Pelvis W Contrast  Result Date: 01/31/2020 CLINICAL DATA:  Abdominal pain with nausea and diarrhea EXAM: CT ABDOMEN AND PELVIS WITH CONTRAST TECHNIQUE: Multidetector CT imaging of the abdomen and pelvis was performed using the standard protocol following bolus administration of intravenous contrast. CONTRAST:  OMNIPAQUE IOHEXOL 300 MG/ML  SOLN COMPARISON:  None. FINDINGS: Lower chest: There is slight bibasilar atelectasis. Lung bases otherwise are clear. There is a small hiatal hernia. Hepatobiliary: There is a cyst in the periphery of the right lobe of the liver posteriorly measuring 1.2 x 1.0 cm. Several scattered 2-3 mm likely cysts are also noted in the right lobe of the liver. There is mild fatty infiltration near the fissure for the ligamentum teres. The gallbladder is absent. There is no appreciable intrahepatic biliary duct dilatation. Common hepatic duct measures 8 mm which may be within normal limits for post cholecystectomy state. No biliary duct mass or calculus is appreciable. Pancreas: There is no appreciable pancreatic mass or inflammatory focus. Spleen: No splenic lesions are appreciable. A small splenule is noted anterior to the spleen. Adrenals/Urinary Tract: There is a 1.8 x 1.7 cm left adrenal mass which based on attenuation criteria must be considered indeterminate with respect to etiology. Right adrenal appears normal. Kidneys bilaterally show no evident mass or hydronephrosis on either side. There  is a 2 mm nonobstructing calculus in the mid right kidney. There is no appreciable ureteral calculus on either side. Urinary bladder is midline with wall thickness within normal limits. Stomach/Bowel: There is no appreciable bowel wall or mesenteric thickening. The terminal ileum appears normal. No evident bowel obstruction. There is no appreciable free air or portal venous air. Vascular/Lymphatic: No abdominal aortic aneurysm. No arterial vascular lesions are appreciable. Major venous structures appear patent. There is no appreciable adenopathy in the abdomen or pelvis by size criteria. There are multiple subcentimeter mesenteric lymph nodes in the right mid to lower abdomen. Reproductive: The uterus is anteverted. Uterus appears inhomogeneous in attenuation, likely due to leiomyomatous change within the uterus. There is an apparent collapsed right ovarian cyst measuring 1.6 x 1.6 cm. No other pelvic mass evident. Other: Appendix appears unremarkable. No abscess or ascites is appreciable in the abdomen or pelvis. There is a lentiform shaped fluid collection anterior to the rectus muscle slightly superior to the level of the umbilicus measuring 4.2 x 1.2 x 3.5 cm. No other abdominal wall lesions are evident. Musculoskeletal: There is degenerative change in the lumbar spine with vacuum phenomenon at L4-5 and L5-S1. No blastic or lytic bone lesions are evident. No intramuscular lesions appreciable. IMPRESSION: 1. No bowel wall thickening or bowel obstruction. Appendix appears normal. No abscess evident in the abdomen or pelvis. 2. Nonobstructing 2 mm calculus mid right kidney. No hydronephrosis on either side. No ureteral calculus on either side. Urinary bladder wall thickness normal. 3. Lentiform shaped fluid collection in the anterior abdominal wall slightly superior to the umbilicus. This collection measures 4.2 x 1.2 x 3.5 cm. Suspect liquified hematoma in this area,  potentially of postoperative etiology. 4. Left  adrenal mass measuring 1.8 x 1.7 cm. This mass must be considered indeterminate based on attenuation criteria. Per consensus guidelines, a CT in 1 year to reassess this left adrenal mass advised. 5.  Suspected leiomyomatous uterus. 6.  Apparent collapsed right ovarian cyst measuring 1.6 x 1.6 cm. 7.  Gallbladder absent. 8.  Small hiatal hernia. Electronically Signed   By: Bretta Bang III M.D.   On: 01/31/2020 06:55    Procedures Procedures (including critical care time)  Medications Ordered in ED Medications  sodium chloride 0.9 % bolus 1,000 mL (has no administration in time range)    ED Course  I have reviewed the triage vital signs and the nursing notes.  Pertinent labs & imaging results that were available during my care of the patient were reviewed by me and considered in my medical decision making (see chart for details).    MDM Rules/Calculators/A&P                         Iv ns bolus. zofran iv. Dilaudid .5 mg iv.   Labs sent.  Reviewed nursing notes and prior charts for additional history.   Initial labs reviewed/interpreted by me - wbc sl elevated. Chem normal except hco3 mildly low, c/w dehydration. u preg neg.  Pt indicates she is concerned with increased pain, and requests CT. Will order.   Additional ns bolus.   CT reviewed/interpreted by me - no appendicitis or diverticulitis. Several incidental findings and need f/u discussed w pt.   Recheck abd soft nt. No emesis.   Pt appears stable for d/c.      Final Clinical Impression(s) / ED Diagnoses Final diagnoses:  None    Rx / DC Orders ED Discharge Orders    None       Cathren Laine, MD 01/31/20 [id number]

## 2020-02-07 DIAGNOSIS — N83209 Unspecified ovarian cyst, unspecified side: Secondary | ICD-10-CM | POA: Diagnosis not present

## 2020-02-07 DIAGNOSIS — E278 Other specified disorders of adrenal gland: Secondary | ICD-10-CM | POA: Diagnosis not present

## 2020-02-07 DIAGNOSIS — K219 Gastro-esophageal reflux disease without esophagitis: Secondary | ICD-10-CM | POA: Diagnosis not present

## 2020-02-07 DIAGNOSIS — R1310 Dysphagia, unspecified: Secondary | ICD-10-CM | POA: Diagnosis not present

## 2020-02-07 DIAGNOSIS — D259 Leiomyoma of uterus, unspecified: Secondary | ICD-10-CM | POA: Diagnosis not present

## 2020-03-06 DIAGNOSIS — K209 Esophagitis, unspecified without bleeding: Secondary | ICD-10-CM | POA: Diagnosis not present

## 2020-03-06 DIAGNOSIS — K317 Polyp of stomach and duodenum: Secondary | ICD-10-CM | POA: Diagnosis not present

## 2020-03-06 DIAGNOSIS — K219 Gastro-esophageal reflux disease without esophagitis: Secondary | ICD-10-CM | POA: Diagnosis not present

## 2020-03-06 DIAGNOSIS — R1310 Dysphagia, unspecified: Secondary | ICD-10-CM | POA: Diagnosis not present

## 2020-03-28 DIAGNOSIS — Z01419 Encounter for gynecological examination (general) (routine) without abnormal findings: Secondary | ICD-10-CM | POA: Diagnosis not present

## 2020-03-29 DIAGNOSIS — Z1231 Encounter for screening mammogram for malignant neoplasm of breast: Secondary | ICD-10-CM | POA: Diagnosis not present

## 2021-03-07 ENCOUNTER — Other Ambulatory Visit: Payer: Self-pay | Admitting: Family Medicine

## 2021-03-07 DIAGNOSIS — E278 Other specified disorders of adrenal gland: Secondary | ICD-10-CM

## 2021-03-26 ENCOUNTER — Ambulatory Visit
Admission: RE | Admit: 2021-03-26 | Discharge: 2021-03-26 | Disposition: A | Discharge status: Home / Self Care | Payer: 59 | Source: Ambulatory Visit | Attending: Family Medicine | Admitting: Family Medicine

## 2021-03-26 ENCOUNTER — Other Ambulatory Visit: Payer: Self-pay

## 2021-03-26 DIAGNOSIS — E278 Other specified disorders of adrenal gland: Secondary | ICD-10-CM

## 2021-03-26 MED ORDER — IOPAMIDOL (ISOVUE-370) INJECTION 76%
80.0000 mL | Freq: Once | INTRAVENOUS | Status: AC | PRN
  Administered 2021-03-26: 80 mL via INTRAVENOUS

## 2021-04-07 DEATH — deceased

## 2021-09-05 DEATH — deceased
